# Patient Record
Sex: Male | Born: 1985 | Race: Black or African American | Hispanic: No | Marital: Single | Smoking: Current every day smoker
Health system: Southern US, Community
[De-identification: ages and names within clinical notes are randomized; demographics above are authoritative.]

---
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
## 2007-03-15 ENCOUNTER — Emergency Department (HOSPITAL_COMMUNITY): Admission: EM | Admit: 2007-03-15 | Discharge: 2007-03-15 | Payer: Self-pay | Admitting: Family Medicine

## 2007-07-05 ENCOUNTER — Emergency Department (HOSPITAL_COMMUNITY): Admission: EM | Admit: 2007-07-05 | Discharge: 2007-07-05 | Payer: Self-pay | Admitting: Emergency Medicine

## 2007-12-17 IMAGING — CR DG SHOULDER 2+V*R*
3 series · 3 of 3 positions shown · non-contrast
Comparison: none

CLINICAL DATA: Right shoulder pain.
RIGHT SHOULDER - 3 VIEW:

[w shoulder ap external righ]
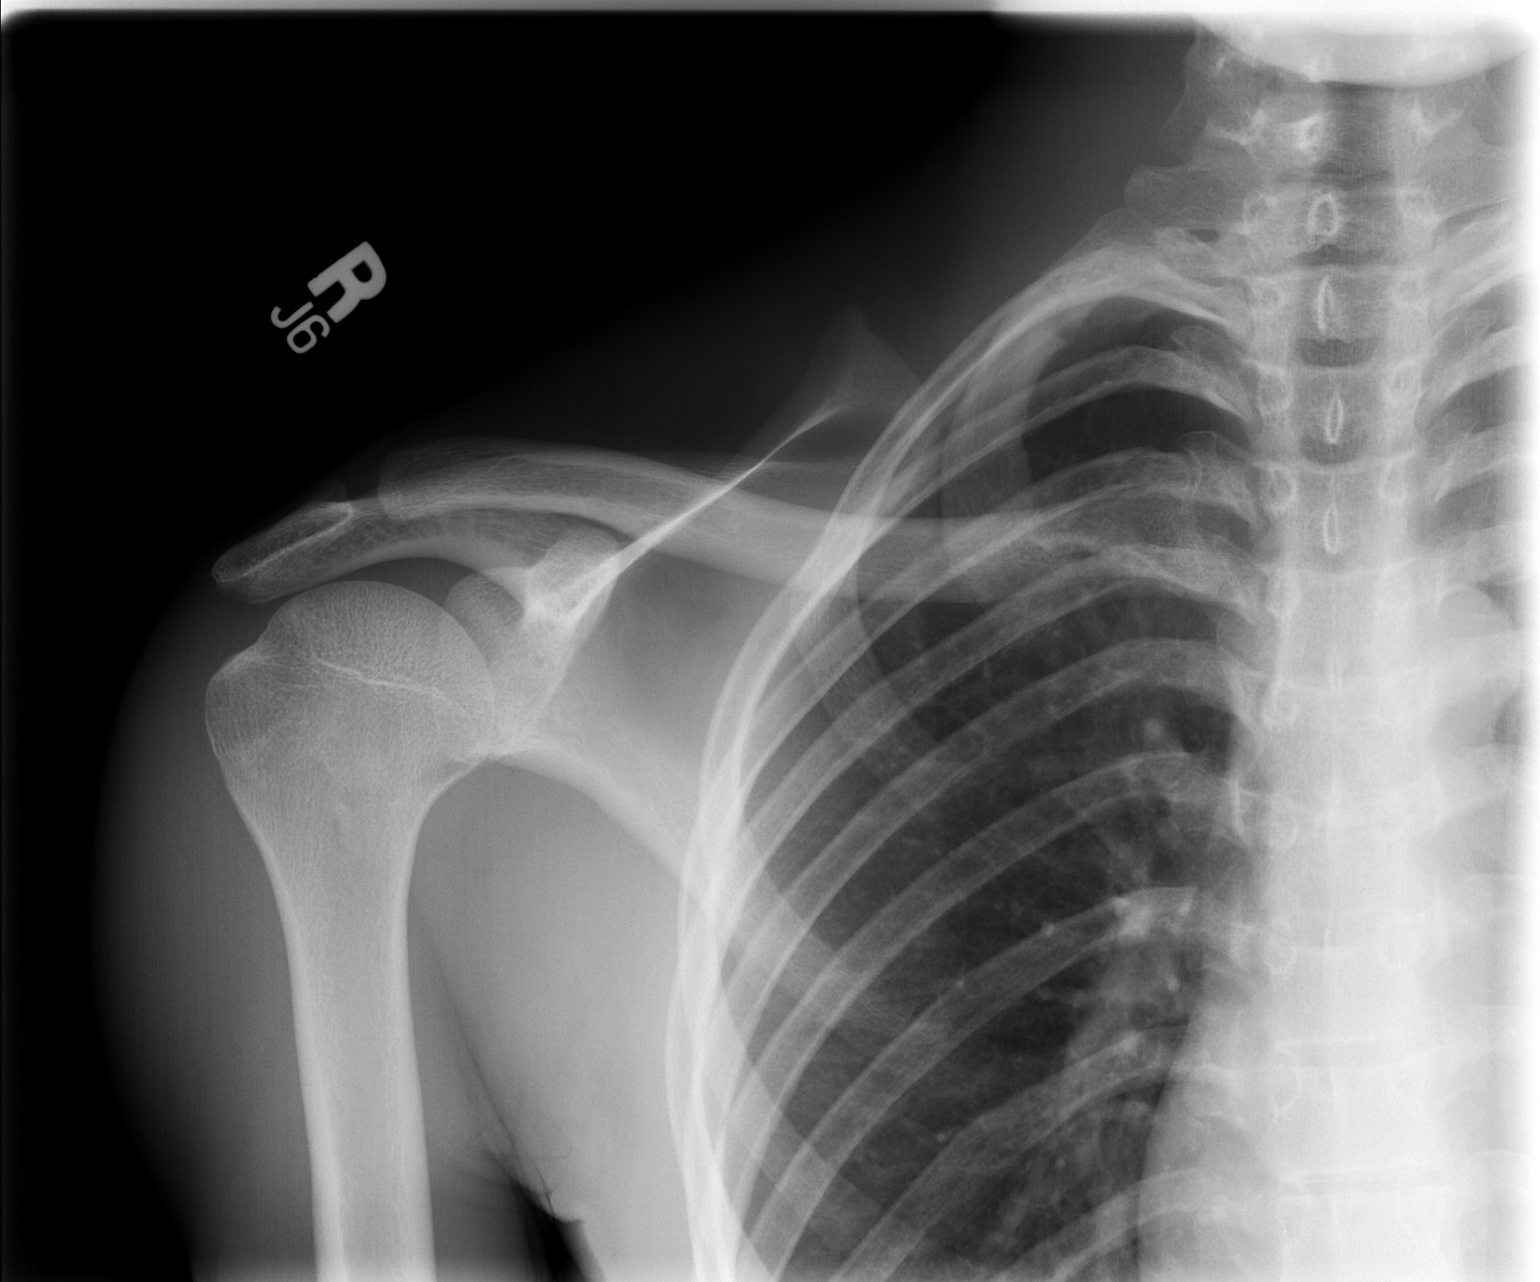

[w shoulder ap internal righ]
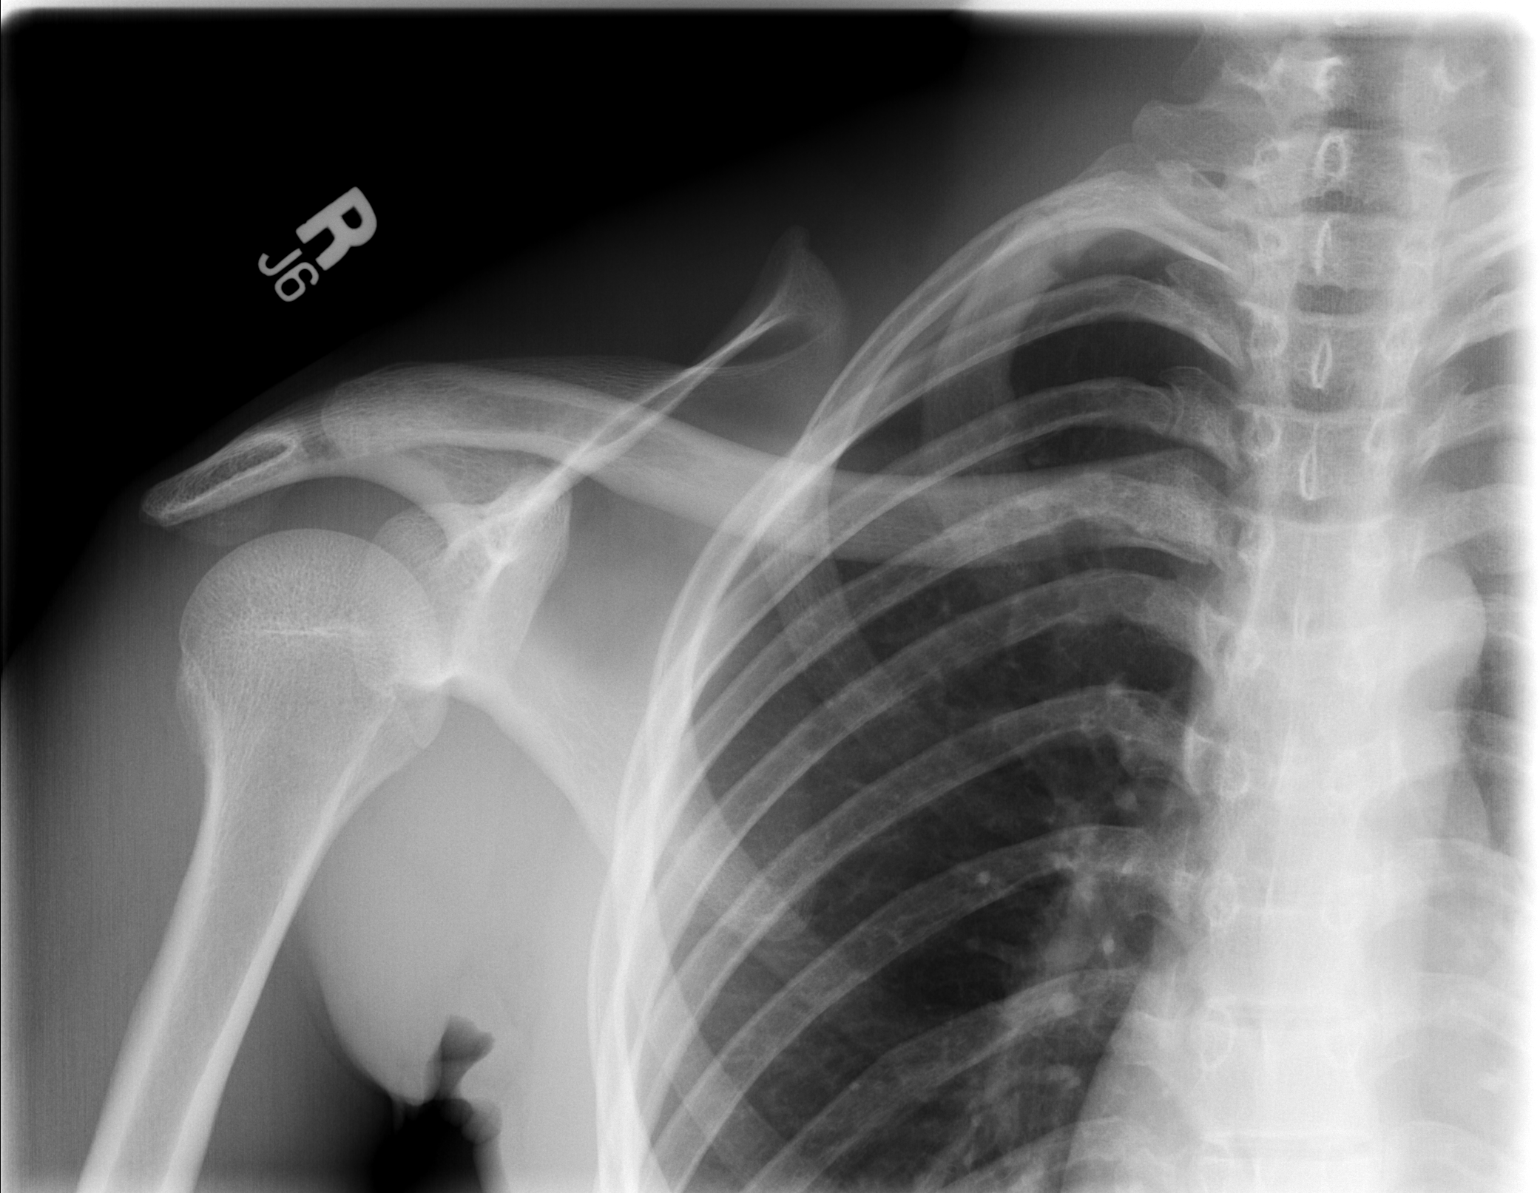

[w shoulder y view right]
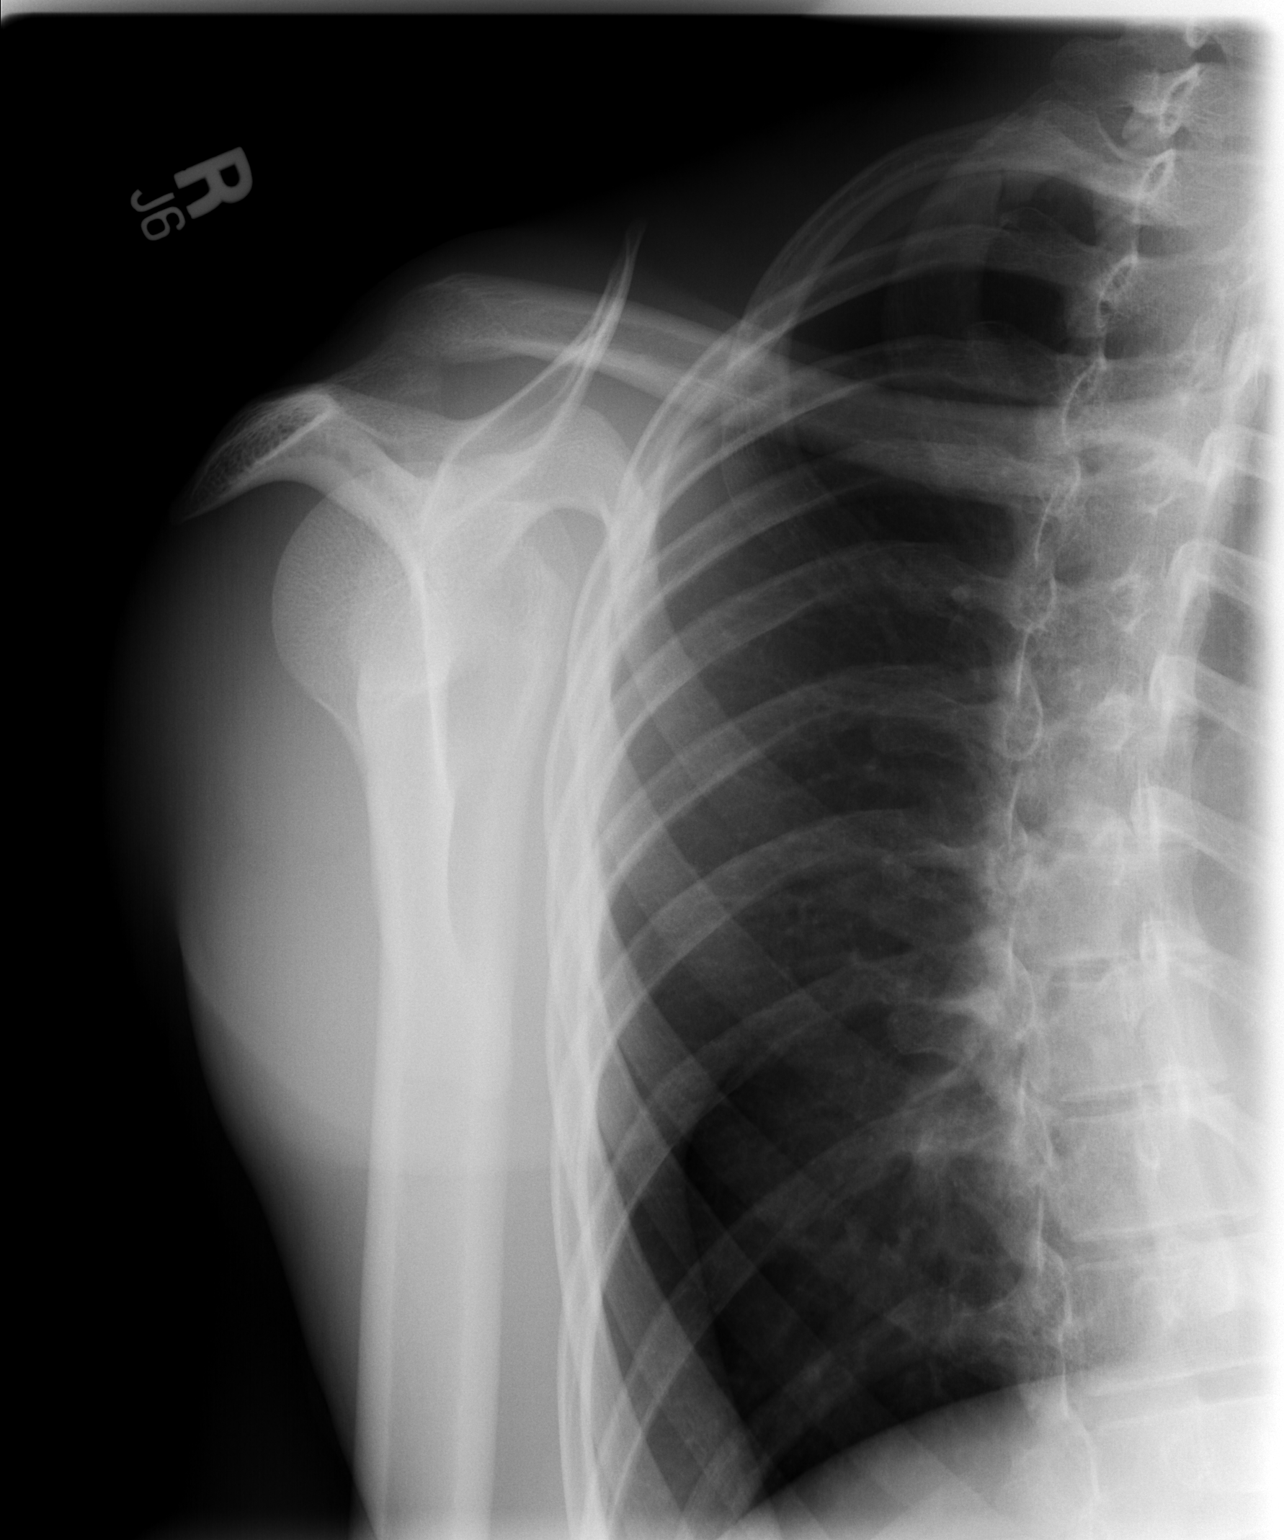

[3 of 3 positions shown; findings below may reference images not displayed]

FINDINGS: There is no evidence of acute fracture, subluxation, or dislocation.  Remote right 1st rib fracture is identified.
IMPRESSION: No acute abnormalities.

## 2008-03-16 ENCOUNTER — Emergency Department (HOSPITAL_COMMUNITY): Admission: EM | Admit: 2008-03-16 | Discharge: 2008-03-16 | Payer: Self-pay | Admitting: Emergency Medicine

## 2008-05-08 ENCOUNTER — Emergency Department (HOSPITAL_COMMUNITY): Admission: EM | Admit: 2008-05-08 | Discharge: 2008-05-08 | Payer: Self-pay | Admitting: Emergency Medicine

## 2010-05-13 LAB — GC/CHLAMYDIA PROBE AMP, GENITAL: Chlamydia, DNA Probe: POSITIVE — AB

## 2010-11-10 LAB — GC/CHLAMYDIA PROBE AMP, GENITAL
Chlamydia, DNA Probe: POSITIVE — AB
GC Probe Amp, Genital: NEGATIVE

## 2015-01-10 ENCOUNTER — Encounter (HOSPITAL_COMMUNITY): Payer: Self-pay | Admitting: *Deleted

## 2015-01-10 ENCOUNTER — Other Ambulatory Visit (HOSPITAL_COMMUNITY)
Admission: RE | Admit: 2015-01-10 | Discharge: 2015-01-10 | Disposition: A | Discharge status: Home / Self Care | Payer: Self-pay | Source: Ambulatory Visit | Attending: Family Medicine | Admitting: Family Medicine

## 2015-01-10 ENCOUNTER — Emergency Department (INDEPENDENT_AMBULATORY_CARE_PROVIDER_SITE_OTHER)
Admission: EM | Admit: 2015-01-10 | Discharge: 2015-01-10 | Disposition: A | Discharge status: Home / Self Care | Payer: Self-pay | Source: Home / Self Care | Attending: Family Medicine | Admitting: Family Medicine

## 2015-01-10 DIAGNOSIS — R300 Dysuria: Secondary | ICD-10-CM

## 2015-01-10 DIAGNOSIS — Z113 Encounter for screening for infections with a predominantly sexual mode of transmission: Secondary | ICD-10-CM | POA: Insufficient documentation

## 2015-01-10 LAB — POCT URINALYSIS DIP (DEVICE)
Bilirubin Urine: NEGATIVE
Glucose, UA: NEGATIVE mg/dL
HGB URINE DIPSTICK: NEGATIVE
Ketones, ur: NEGATIVE mg/dL
LEUKOCYTES UA: NEGATIVE
NITRITE: NEGATIVE
PH: 7 (ref 5.0–8.0)
PROTEIN: NEGATIVE mg/dL
SPECIFIC GRAVITY, URINE: 1.015 (ref 1.005–1.030)
UROBILINOGEN UA: 0.2 mg/dL (ref 0.0–1.0)

## 2015-01-10 NOTE — ED Provider Notes (Signed)
CSN: [id number]     Arrival date & time 01/10/15  1649 History   First MD Initiated Contact with Patient 01/10/15 1848     Chief Complaint  Patient presents with  . Dysuria   (Consider location/radiation/quality/duration/timing/severity/associated sxs/prior Treatment) Patient is a 29 y.o. male presenting with dysuria. The history is provided by the patient.  Dysuria This is a new problem. The current episode started 2 days ago. The problem has not changed since onset.Pertinent negatives include no abdominal pain.    History reviewed. No pertinent past medical history. History reviewed. No pertinent past surgical history. Family History  Problem Relation Age of Onset  . Diabetes Father    Social History  Substance Use Topics  . Smoking status: Current Every Day Smoker  . Smokeless tobacco: None  . Alcohol Use: Yes    Review of Systems  Constitutional: Negative.  Negative for fever.  Gastrointestinal: Negative.  Negative for abdominal pain.  Genitourinary: Positive for dysuria. Negative for urgency, frequency, hematuria, flank pain, discharge, scrotal swelling, genital sores, penile pain and testicular pain.  All other systems reviewed and are negative.   Allergies  Review of patient's allergies indicates no known allergies.  Home Medications   Prior to Admission medications   Not on File   Meds Ordered and Administered this Visit  Medications - No data to display  BP 104/63 mmHg  Pulse 65  Temp(Src) 97.6 F (36.4 C) (Oral)  Resp 16  SpO2 98% No data found.   Physical Exam  Constitutional: He is oriented to person, place, and time. He appears well-developed and well-nourished.  Abdominal: Soft. Bowel sounds are normal. There is no tenderness.  Genitourinary: Penis normal. No penile tenderness.  Neurological: He is alert and oriented to person, place, and time.  Skin: Skin is warm and dry.  Nursing note and vitals reviewed.   ED Course  Procedures  (including critical care time)  Labs Review Labs Reviewed  POCT URINALYSIS DIP (DEVICE)  URINE CYTOLOGY ANCILLARY ONLY   U/a neg.  Imaging Review No results found.   Visual Acuity Review  Right Eye Distance:   Left Eye Distance:   Bilateral Distance:    Right Eye Near:   Left Eye Near:    Bilateral Near:         MDM   1. Dysuria        Linna HoffJames D Kindl, MD 01/10/15 [phone]

## 2015-01-10 NOTE — Discharge Instructions (Signed)
We will call with test results and treat as indicated °

## 2015-01-10 NOTE — ED Notes (Signed)
Pt  Reports  Cloudy  Urination  With a  Tingling  Sensation          Pt  Reports         Symptoms         X   2  Days               Pt       Sitting  Upright on  Exam table  In  No  Acute  Distress

## 2015-01-11 LAB — URINE CYTOLOGY ANCILLARY ONLY
Chlamydia: NEGATIVE
NEISSERIA GONORRHEA: NEGATIVE
TRICH (WINDOWPATH): NEGATIVE

## 2016-01-18 ENCOUNTER — Ambulatory Visit (HOSPITAL_COMMUNITY)
Admission: EM | Admit: 2016-01-18 | Discharge: 2016-01-18 | Disposition: A | Discharge status: Home / Self Care | Payer: Self-pay | Attending: Internal Medicine | Admitting: Internal Medicine

## 2016-01-18 ENCOUNTER — Encounter (HOSPITAL_COMMUNITY): Payer: Self-pay

## 2016-01-18 DIAGNOSIS — M67431 Ganglion, right wrist: Secondary | ICD-10-CM | POA: Insufficient documentation

## 2016-01-18 DIAGNOSIS — F17200 Nicotine dependence, unspecified, uncomplicated: Secondary | ICD-10-CM | POA: Insufficient documentation

## 2016-01-18 DIAGNOSIS — R300 Dysuria: Secondary | ICD-10-CM | POA: Insufficient documentation

## 2016-01-18 LAB — POCT URINALYSIS DIP (DEVICE)
Bilirubin Urine: NEGATIVE
GLUCOSE, UA: NEGATIVE mg/dL
HGB URINE DIPSTICK: NEGATIVE
Ketones, ur: NEGATIVE mg/dL
LEUKOCYTES UA: NEGATIVE
NITRITE: NEGATIVE
Protein, ur: NEGATIVE mg/dL
Specific Gravity, Urine: >=1.03 (ref 1.005–1.030)
UROBILINOGEN UA: 0.2 mg/dL (ref 0.0–1.0)
pH: 6.5 (ref 5.0–8.0)

## 2016-01-18 MED ORDER — AZITHROMYCIN 250 MG PO TABS
ORAL_TABLET | ORAL | Status: AC
  Filled 2016-01-18: qty 4

## 2016-01-18 MED ORDER — LIDOCAINE HCL (PF) 2 % IJ SOLN
INTRAMUSCULAR | Status: AC
  Filled 2016-01-18: qty 2

## 2016-01-18 MED ORDER — CEFTRIAXONE SODIUM 250 MG IJ SOLR
INTRAMUSCULAR | Status: AC
  Filled 2016-01-18: qty 250

## 2016-01-18 MED ORDER — ONDANSETRON 4 MG PO TBDP
8.0000 mg | ORAL_TABLET | Freq: Once | ORAL | Status: AC | PRN
  Administered 2016-01-18: 8 mg via ORAL

## 2016-01-18 MED ORDER — CEFTRIAXONE SODIUM 250 MG IJ SOLR
250.0000 mg | Freq: Once | INTRAMUSCULAR | Status: AC
  Administered 2016-01-18: 250 mg via INTRAMUSCULAR

## 2016-01-18 MED ORDER — ONDANSETRON 4 MG PO TBDP
ORAL_TABLET | ORAL | Status: AC
  Filled 2016-01-18: qty 2

## 2016-01-18 MED ORDER — AZITHROMYCIN 250 MG PO TABS
1000.0000 mg | ORAL_TABLET | Freq: Once | ORAL | Status: AC
  Administered 2016-01-18: 1000 mg via ORAL

## 2016-01-18 NOTE — ED Triage Notes (Signed)
Pt would like to be checked for STD's, Has a tingling feeling when he urinates for 3 or 4 days and said he would like a UA

## 2016-01-18 NOTE — Discharge Instructions (Signed)
Urine test done today at urgent care; results will be available in a few days.  Injection of rocephin and oral dose of zithromax given at urgent care.  Followup with hand doctor to discuss best long term management strategy for right wrist ganglion cyst, which is intermittently painful/swollen.  Recheck at urgent care for further evaluation if urinary symptoms persist.

## 2016-01-18 NOTE — ED Provider Notes (Signed)
[facility]    CSN: [id number] Arrival date & time: 01/18/16  1825     History   Chief Complaint Chief Complaint  Patient presents with  . SEXUALLY TRANSMITTED DISEASE    HPI Joe Meza is a 30 y.o. male. He presents today with 3-4 day history of dysuria, says that a male partner told him she had an infection from wiping. No discharge, no scrotal symptoms. No rash. No abdominal or pelvic pain. No fever, no malaise. 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 He has intermittent difficulty with swelling and pain of a right wrist ganglion cyst.  This is currently small, not inflamed. He would like to get everything taken care of once, however.  HPI  History reviewed. No pertinent past medical history.  History reviewed. No pertinent surgical history.     Home Medications    Takes no meds regularly  Family History Family History  Problem Relation Age of Onset  . Diabetes Father     Social History Social History  Substance Use Topics  . Smoking status: Current Every Day Smoker  . Smokeless tobacco: Never Used  . Alcohol use Yes     Allergies   Patient has no known allergies.   Review of Systems Review of Systems  All other systems reviewed and are negative.    Physical Exam Triage Vital Signs ED Triage Vitals  Enc Vitals Group     BP 01/18/16 1903 112/90     Pulse Rate 01/18/16 1903 63     Resp 01/18/16 1903 16     Temp 01/18/16 1903 98 F (36.7 C)     Temp Source 01/18/16 1903 Oral     SpO2 01/18/16 1903 100 %     Weight --      Height --      Pain Score 01/18/16 1906 0   Updated Vital Signs BP 112/90 (BP Location: Left Arm)   Pulse 63   Temp 98 F (36.7 C) (Oral)   Resp 16   SpO2 100%  Physical Exam  Constitutional: He is oriented to person, place, and time. No distress.  Alert, nicely groomed  HENT:  Head: Atraumatic.  Eyes:  Conjugate gaze, no eye redness/drainage  Neck: Neck supple.    Cardiovascular: Normal rate.   Pulmonary/Chest: No respiratory distress.  Abdominal: He exhibits no distension.  Musculoskeletal: Normal range of motion.  Good range of motion at the right wrist. 8 mm noninflamed, nontender cystic lesion in the radial aspect of the right wrist consistent with ganglion  Neurological: He is alert and oriented to person, place, and time.  Skin: Skin is warm and dry.  No cyanosis  Nursing note and vitals reviewed.    UC Treatments / Results  Labs (all labs ordered are listed, but only abnormal results are displayed) Labs Reviewed  URINE CULTURE  POCT URINALYSIS DIP (DEVICE)  URINE CYTOLOGY ANCILLARY ONLY    Procedures Procedures (including critical care time)  splint applied to right wrist, to use prn ganglion cyst inflammation  Medications Ordered in UC Medications  cefTRIAXone (ROCEPHIN) injection 250 mg (250 mg Intramuscular Given 01/18/16 1942)  azithromycin (ZITHROMAX) tablet 1,000 mg (1,000 mg Oral Given 01/18/16 1939)  ondansetron (ZOFRAN-ODT) disintegrating tablet 8 mg (8 mg Oral Given 01/18/16 1938)     Final Clinical Impressions(s) / UC Diagnoses   Final diagnoses:  Dysuria  Ganglion cyst of wrist, right   Urine test done today at  urgent care; results will be available in a few days.  Injection of rocephin and oral dose of zithromax given at urgent care.  Followup with hand doctor to discuss best long term management strategy for right wrist ganglion cyst, which is intermittently painful/swollen.  Recheck at urgent care for further evaluation if urinary symptoms persist.     Eustace MooreLaura W Murray, MD 01/18/16 [phone]

## 2016-01-20 LAB — URINE CULTURE: Culture: NO GROWTH

## 2016-01-21 LAB — URINE CYTOLOGY ANCILLARY ONLY
CHLAMYDIA, DNA PROBE: NEGATIVE
NEISSERIA GONORRHEA: NEGATIVE
Trichomonas: NEGATIVE
